# Patient Record
Sex: Male | Born: 1965 | Race: White | Hispanic: No | Marital: Married | Smoking: Never smoker
Health system: Southern US, Community
[De-identification: ages and names within clinical notes are randomized; demographics above are authoritative.]

## PROBLEM LIST (undated history)

## (undated) DIAGNOSIS — N200 Calculus of kidney: Secondary | ICD-10-CM

## (undated) HISTORY — PX: TESTICLE SURGERY: SHX794

## (undated) HISTORY — PX: HERNIA REPAIR: SHX51

## (undated) HISTORY — PX: KNEE SURGERY: SHX244

## (undated) HISTORY — PX: OTHER SURGICAL HISTORY: SHX169

## (undated) HISTORY — PX: LITHOTRIPSY: SUR834

---

## 2003-04-29 ENCOUNTER — Encounter: Admission: RE | Admit: 2003-04-29 | Discharge: 2003-04-29 | Payer: Self-pay | Admitting: Otolaryngology

## 2014-11-05 ENCOUNTER — Other Ambulatory Visit: Payer: Self-pay | Admitting: Cardiology

## 2014-11-05 DIAGNOSIS — R079 Chest pain, unspecified: Secondary | ICD-10-CM

## 2014-11-10 ENCOUNTER — Encounter (HOSPITAL_COMMUNITY)
Admission: RE | Admit: 2014-11-10 | Discharge: 2014-11-10 | Disposition: A | Discharge status: Home / Self Care | Payer: 59 | Source: Ambulatory Visit | Attending: Cardiology | Admitting: Cardiology

## 2014-11-10 ENCOUNTER — Encounter (HOSPITAL_COMMUNITY): Admission: RE | Admit: 2014-11-10 | Payer: 59 | Source: Ambulatory Visit

## 2014-11-10 DIAGNOSIS — R079 Chest pain, unspecified: Secondary | ICD-10-CM | POA: Diagnosis not present

## 2014-11-10 DIAGNOSIS — R0602 Shortness of breath: Secondary | ICD-10-CM | POA: Diagnosis not present

## 2014-11-10 MED ORDER — TECHNETIUM TC 99M SESTAMIBI GENERIC - CARDIOLITE
10.0000 | Freq: Once | INTRAVENOUS | Status: AC | PRN
  Administered 2014-11-10: 10 via INTRAVENOUS

## 2014-11-10 MED ORDER — TECHNETIUM TC 99M SESTAMIBI GENERIC - CARDIOLITE
30.0000 | Freq: Once | INTRAVENOUS | Status: AC | PRN
  Administered 2014-11-10: 30 via INTRAVENOUS

## 2015-03-02 DIAGNOSIS — R7303 Prediabetes: Secondary | ICD-10-CM | POA: Diagnosis not present

## 2015-03-02 DIAGNOSIS — Z9109 Other allergy status, other than to drugs and biological substances: Secondary | ICD-10-CM | POA: Diagnosis not present

## 2015-03-02 DIAGNOSIS — E782 Mixed hyperlipidemia: Secondary | ICD-10-CM | POA: Diagnosis not present

## 2015-04-19 DIAGNOSIS — R7303 Prediabetes: Secondary | ICD-10-CM | POA: Diagnosis not present

## 2015-06-10 DIAGNOSIS — L550 Sunburn of first degree: Secondary | ICD-10-CM | POA: Diagnosis not present

## 2015-06-10 DIAGNOSIS — M109 Gout, unspecified: Secondary | ICD-10-CM | POA: Diagnosis not present

## 2015-06-20 DIAGNOSIS — M109 Gout, unspecified: Secondary | ICD-10-CM | POA: Diagnosis not present

## 2015-07-16 DIAGNOSIS — M722 Plantar fascial fibromatosis: Secondary | ICD-10-CM | POA: Diagnosis not present

## 2015-07-16 DIAGNOSIS — M7742 Metatarsalgia, left foot: Secondary | ICD-10-CM | POA: Diagnosis not present

## 2015-08-06 DIAGNOSIS — M7742 Metatarsalgia, left foot: Secondary | ICD-10-CM | POA: Diagnosis not present

## 2015-08-06 DIAGNOSIS — M722 Plantar fascial fibromatosis: Secondary | ICD-10-CM | POA: Diagnosis not present

## 2015-10-02 DIAGNOSIS — S86812A Strain of other muscle(s) and tendon(s) at lower leg level, left leg, initial encounter: Secondary | ICD-10-CM | POA: Diagnosis not present

## 2015-10-02 DIAGNOSIS — M1712 Unilateral primary osteoarthritis, left knee: Secondary | ICD-10-CM | POA: Diagnosis not present

## 2015-10-02 DIAGNOSIS — M2392 Unspecified internal derangement of left knee: Secondary | ICD-10-CM | POA: Diagnosis not present

## 2015-10-04 DIAGNOSIS — M25562 Pain in left knee: Secondary | ICD-10-CM | POA: Diagnosis not present

## 2015-10-09 DIAGNOSIS — M2242 Chondromalacia patellae, left knee: Secondary | ICD-10-CM | POA: Diagnosis not present

## 2015-10-09 DIAGNOSIS — S83201A Bucket-handle tear of unspecified meniscus, current injury, left knee, initial encounter: Secondary | ICD-10-CM | POA: Diagnosis not present

## 2015-10-09 DIAGNOSIS — M659 Synovitis and tenosynovitis, unspecified: Secondary | ICD-10-CM | POA: Diagnosis not present

## 2015-10-09 DIAGNOSIS — M94262 Chondromalacia, left knee: Secondary | ICD-10-CM | POA: Diagnosis not present

## 2015-10-09 DIAGNOSIS — S83242A Other tear of medial meniscus, current injury, left knee, initial encounter: Secondary | ICD-10-CM | POA: Diagnosis not present

## 2015-12-08 ENCOUNTER — Emergency Department (HOSPITAL_COMMUNITY)
Admission: EM | Admit: 2015-12-08 | Discharge: 2015-12-08 | Disposition: A | Discharge status: Home / Self Care | Payer: 59 | Attending: Emergency Medicine | Admitting: Emergency Medicine

## 2015-12-08 ENCOUNTER — Emergency Department (HOSPITAL_COMMUNITY): Payer: 59

## 2015-12-08 ENCOUNTER — Encounter (HOSPITAL_COMMUNITY): Payer: Self-pay | Admitting: Emergency Medicine

## 2015-12-08 DIAGNOSIS — Z91010 Allergy to peanuts: Secondary | ICD-10-CM | POA: Diagnosis not present

## 2015-12-08 DIAGNOSIS — N132 Hydronephrosis with renal and ureteral calculous obstruction: Secondary | ICD-10-CM | POA: Diagnosis not present

## 2015-12-08 DIAGNOSIS — N200 Calculus of kidney: Secondary | ICD-10-CM | POA: Diagnosis not present

## 2015-12-08 DIAGNOSIS — N202 Calculus of kidney with calculus of ureter: Secondary | ICD-10-CM | POA: Diagnosis not present

## 2015-12-08 DIAGNOSIS — R109 Unspecified abdominal pain: Secondary | ICD-10-CM | POA: Diagnosis not present

## 2015-12-08 HISTORY — DX: Calculus of kidney: N20.0

## 2015-12-08 LAB — BASIC METABOLIC PANEL
ANION GAP: 7 (ref 5–15)
BUN: 16 mg/dL (ref 6–20)
CALCIUM: 9.3 mg/dL (ref 8.9–10.3)
CO2: 27 mmol/L (ref 22–32)
CREATININE: 1.39 mg/dL — AB (ref 0.61–1.24)
Chloride: 108 mmol/L (ref 101–111)
GFR, EST NON AFRICAN AMERICAN: 57 mL/min — AB (ref >60)
Glucose, Bld: 148 mg/dL — ABNORMAL HIGH (ref 65–99)
Potassium: 4.1 mmol/L (ref 3.5–5.1)
SODIUM: 142 mmol/L (ref 135–145)

## 2015-12-08 LAB — CBC
HCT: 42.5 % (ref 39.0–52.0)
HEMOGLOBIN: 14.9 g/dL (ref 13.0–17.0)
MCH: 31.1 pg (ref 26.0–34.0)
MCHC: 35.1 g/dL (ref 30.0–36.0)
MCV: 88.7 fL (ref 78.0–100.0)
PLATELETS: 257 10*3/uL (ref 150–400)
RBC: 4.79 MIL/uL (ref 4.22–5.81)
RDW: 12.8 % (ref 11.5–15.5)
WBC: 13.8 10*3/uL — AB (ref 4.0–10.5)

## 2015-12-08 LAB — URINALYSIS, ROUTINE W REFLEX MICROSCOPIC
BILIRUBIN URINE: NEGATIVE
Glucose, UA: NEGATIVE mg/dL
Hgb urine dipstick: NEGATIVE
KETONES UR: NEGATIVE mg/dL
LEUKOCYTES UA: NEGATIVE
NITRITE: NEGATIVE
PH: 5 (ref 5.0–8.0)
PROTEIN: NEGATIVE mg/dL
Specific Gravity, Urine: 1.02 (ref 1.005–1.030)

## 2015-12-08 MED ORDER — OXYCODONE-ACETAMINOPHEN 5-325 MG PO TABS: 1.0000 | ORAL_TABLET | Freq: Four times a day (QID) | ORAL | 0 refills | Status: DC | PRN

## 2015-12-08 MED ORDER — ONDANSETRON HCL 4 MG/2ML IJ SOLN
4.0000 mg | Freq: Once | INTRAMUSCULAR | Status: AC
  Administered 2015-12-08: 4 mg via INTRAVENOUS
  Filled 2015-12-08: qty 2

## 2015-12-08 MED ORDER — ONDANSETRON HCL 4 MG PO TABS
4.0000 mg | ORAL_TABLET | Freq: Three times a day (TID) | ORAL | 0 refills | Status: AC | PRN
Start: 2015-12-08 — End: ?

## 2015-12-08 MED ORDER — FENTANYL CITRATE (PF) 100 MCG/2ML IJ SOLN
50.0000 ug | INTRAMUSCULAR | Status: DC | PRN
  Administered 2015-12-08: 50 ug via NASAL
  Filled 2015-12-08: qty 2

## 2015-12-08 MED ORDER — TAMSULOSIN HCL 0.4 MG PO CAPS: 0.4000 mg | ORAL_CAPSULE | Freq: Every day | ORAL | 0 refills | Status: DC

## 2015-12-08 MED ORDER — KETOROLAC TROMETHAMINE 30 MG/ML IJ SOLN
30.0000 mg | Freq: Once | INTRAMUSCULAR | Status: AC
  Administered 2015-12-08: 30 mg via INTRAVENOUS
  Filled 2015-12-08: qty 1

## 2015-12-08 MED ORDER — TRAMADOL HCL 50 MG PO TABS: 50.0000 mg | ORAL_TABLET | Freq: Four times a day (QID) | ORAL | 0 refills | Status: AC | PRN

## 2015-12-08 MED ORDER — TAMSULOSIN HCL 0.4 MG PO CAPS: 0.4000 mg | ORAL_CAPSULE | Freq: Every day | ORAL | 0 refills | Status: AC

## 2015-12-08 MED ORDER — HYDROMORPHONE HCL 1 MG/ML IJ SOLN
1.0000 mg | Freq: Once | INTRAMUSCULAR | Status: AC
  Administered 2015-12-08: 1 mg via INTRAVENOUS
  Filled 2015-12-08: qty 1

## 2015-12-08 NOTE — ED Notes (Signed)
Successful IV attempt but unable to obtain blood.  Will attempt when pt feels more comfortable.

## 2015-12-08 NOTE — ED Provider Notes (Signed)
[facility]-EMERGENCY DEPT Provider Note   CSN: [id number] Arrival date & time: 12/08/15  1554     History   Chief Complaint Chief Complaint  Patient presents with  . Flank Pain    HPI Jonathon Gross is a 50 y.o. male.  HPI   50 year old male with hx of kidney stone here with flank pain.  Patient report acute onset of right flank pain that started approximately 2-3 hours ago. Pain initially starting his right flank but now has traveled down to his right groin. Rate pain as 12 out of 10 nothing seems to make it better or worse. States that he felt nauseous, vomited once, break out into sweats and feeling chills. He has urinated before the pain started and hasn't urinated since. He denies having fever, chills, lightheadedness, dizziness, chest pain, shortness of breath, productive cough, hematuria, scrotal swelling, testicle pain, or rash. Denies any recent injury or strenuous activities. He has history of right inguinal hernia that was surgically repaired. He had a lithotripsy procedure done in 2005.     Past Medical History:  Diagnosis Date  . Kidney stones     There are no active problems to display for this patient.   Past Surgical History:  Procedure Laterality Date  . HERNIA REPAIR    . KNEE SURGERY    . LITHOTRIPSY    . orthoscopy    . TESTICLE SURGERY         Home Medications    Prior to Admission medications   Not on File    Family History No family history on file.  Social History Social History  Substance Use Topics  . Smoking status: Never Smoker  . Smokeless tobacco: Current User    Types: Chew  . Alcohol use No     Allergies   Bee venom; Peanuts [peanut oil]; Milk-related compounds; Percocet [oxycodone-acetaminophen]; and Shellfish allergy   Review of Systems Review of Systems  All other systems reviewed and are negative.    Physical Exam Updated Vital Signs BP 159/92   Pulse 65   Temp 97.7 F (36.5 C) (Oral)   Resp 19   Ht  6' (1.829 m)   Wt 97.5 kg   SpO2 98%   BMI 29.16 kg/m   Physical Exam  Constitutional: He appears well-developed and well-nourished. He appears distressed (Patient appears uncomfortable, writhing in bed).  HENT:  Head: Atraumatic.  Eyes: Conjunctivae are normal.  Neck: Neck supple.  Cardiovascular: Normal rate and regular rhythm.   Pulmonary/Chest: Effort normal and breath sounds normal.  Abdominal: Soft. Bowel sounds are normal. He exhibits no distension. There is no tenderness.  Genitourinary:  Genitourinary Comments: Chaperone present during exam. No inguinal lymphadenopathy or inguinal hernia noted. Has nontender on palpation with normal lie. Normal scrotum. Perineum is soft and nontender.  Musculoskeletal:  No CVA tenderness.  Neurological: He is alert.  Skin: No rash noted.  Psychiatric: He has a normal mood and affect.  Nursing note and vitals reviewed.    ED Treatments / Results  Labs (all labs ordered are listed, but only abnormal results are displayed) Labs Reviewed  BASIC METABOLIC PANEL - Abnormal; Notable for the following:       Result Value   Glucose, Bld 148 (*)    Creatinine, Ser 1.39 (*)    GFR calc non Af Amer 57 (*)    All other components within normal limits  CBC - Abnormal; Notable for the following:    WBC 13.8 (*)  All other components within normal limits  URINE CULTURE  URINALYSIS, ROUTINE W REFLEX MICROSCOPIC (NOT AT [facility])    EKG  EKG Interpretation None       Radiology Ct Renal Stone Study  Result Date: 12/08/2015 CLINICAL DATA:  50 year old male with acute right flank pain radiating to the lower abdomen beginning after urination. Initial encounter. EXAM: CT ABDOMEN AND PELVIS WITHOUT CONTRAST TECHNIQUE: Multidetector CT imaging of the abdomen and pelvis was performed following the standard protocol without IV contrast. COMPARISON:  CT Abdomen and Pelvis 08/27/2003 FINDINGS: Lower chest: Negative.  No pericardial or pleural  effusion. No upper abdominal free air. Hepatobiliary: New heterogeneous steatosis of the liver since 2005. Some fatty sparing along the gallbladder fossa. Negative noncontrast gallbladder. Pancreas: Negative. Spleen: Negative. Adrenals/Urinary Tract: Negative adrenal glands. Negative noncontrast left kidney and left ureter. Mild to moderate right hydronephrosis. Moderate right perinephric stranding. Right hydroureter with periureteral stranding. Multiple intrarenal calculi on the right individually up to 6 mm. The right ureter remains asymmetrically enlarged to the ureterovesical junction where a small round 3 mm calculus is present (series 2, image 84 and coronal image 106. Otherwise unremarkable urinary bladder. Stomach/Bowel: Negative rectum. Mild to moderate sigmoid diverticulosis is new since 2005, no active inflammation. Decompressed left colon. Negative transverse colon. Negative right colon. Normal appendix. Negative terminal ileum. No dilated small bowel. Relatively decompressed stomach. Decompressed duodenum. Vascular/Lymphatic: Negative noncontrast appearance of vasculature. No lymphadenopathy identified. Reproductive: Negative. Other: No pelvic free fluid. Musculoskeletal: No acute osseous abnormality identified. IMPRESSION: 1. Acute obstructive uropathy on the right. Round 3 mm calculus at the right UVJ. The degree of right perinephric stranding raises the possibility of associated forniceal rupture. 2. Multiple other right renal calculi, individually up to 6 mm. No left nephrolithiasis. 3. Hepatic steatosis, new since 2005. 4. Diverticulosis of the sigmoid colon. Electronically Signed   By: Odessa FlemingH  Hall M.D.   On: 12/08/2015 18:17    Procedures Procedures (including critical care time)  Medications Ordered in ED Medications  fentaNYL (SUBLIMAZE) injection 50 mcg (50 mcg Nasal Given 12/08/15 1607)  ketorolac (TORADOL) 30 MG/ML injection 30 mg (not administered)  ondansetron (ZOFRAN) injection 4 mg  (not administered)     Initial Impression / Assessment and Plan / ED Course  I have reviewed the triage vital signs and the nursing notes.  Pertinent labs & imaging results that were available during my care of the patient were reviewed by me and considered in my medical decision making (see chart for details).  Clinical Course     BP 130/76   Pulse (!) 57   Temp 97.7 F (36.5 C) (Oral)   Resp 18   Ht 6' (1.829 m)   Wt 97.5 kg   SpO2 98%   BMI 29.16 kg/m    Final Clinical Impressions(s) / ED Diagnoses   Final diagnoses:  Kidney stone    New Prescriptions New Prescriptions   ONDANSETRON (ZOFRAN) 4 MG TABLET    Take 1 tablet (4 mg total) by mouth every 8 (eight) hours as needed for nausea or vomiting.   OXYCODONE-ACETAMINOPHEN (PERCOCET/ROXICET) 5-325 MG TABLET    Take 1 tablet by mouth every 6 (six) hours as needed for moderate pain or severe pain.   TAMSULOSIN (FLOMAX) 0.4 MG CAPS CAPSULE    Take 1 capsule (0.4 mg total) by mouth daily.   5:19 PM Patient with history of kidney stones here with acute onset of right flank pain radiates to: Likely suggestive of a kidney stone.  He appears very uncomfortable, pain medication given. Will obtain CT renal study. I have low suspicion for aortic dissection, shingle, acute urinary retention, ruptured appendicitis, or other acute emergent pathology.  8:07 PM UA without any signs of urinary tract infection. Urine culture sent. Elevated white count of 13.8 likely reactive. Acute renal insufficiency with a creatinine of 1.39. CT revealing renal stone study demonstrated a 3 mm stone at the UVJ on the right side and multiple non-occlusive stones in the right kidney. I discussed this finding with on-call urologist, Dr. Judie GrieveLomboy who recommend outpatient follow-up as needed. After receiving several doses of pain medication, patient felt much better and appears to be comfortable. He is stable for discharge. He will follow-up palpation further care.  Return precaution discussed.  Care discussed with DR. Tegeler.    Fayrene HelperBowie Kaia Depaolis, PA-C 12/08/15 2009    Canary Brimhristopher J Tegeler, MD 12/09/15 [phone]

## 2015-12-08 NOTE — ED Notes (Signed)
Lab delay - pt actively vomiting/dry heaving.  RN aware.

## 2015-12-08 NOTE — ED Notes (Signed)
PA at bedside.

## 2015-12-08 NOTE — ED Notes (Signed)
Nurse is going to start a IV and collect the labs 

## 2015-12-08 NOTE — Discharge Instructions (Signed)
You have a 3mm kidney stone on the right side.  Please take medications as prescribed.  Follow up with urology for further management as you have several kidney stones on the right kidney.

## 2015-12-08 NOTE — ED Triage Notes (Signed)
Patient c/o right flank pain about 3 hours ago that radiates now to right lower abd area. Patient states that after he urinated 3 hours ago he has urgency to urinate but states cant urinate.  Patient has PMH kidney stone.

## 2015-12-08 NOTE — ED Notes (Signed)
Pt doesn't think he can give a UA at this time.

## 2015-12-08 NOTE — ED Notes (Signed)
Patient transported to CT 

## 2015-12-10 LAB — URINE CULTURE: Culture: <10000 — AB

## 2016-02-29 DIAGNOSIS — J111 Influenza due to unidentified influenza virus with other respiratory manifestations: Secondary | ICD-10-CM | POA: Diagnosis not present

## 2017-06-12 IMAGING — CT CT RENAL STONE PROTOCOL
2 of 4 series · 16 of 46 positions shown, 18 images · non-contrast
Comparison: CT Abdomen and Pelvis 08/27/2003

CLINICAL DATA: 50-year-old male with acute right flank pain
radiating to the lower abdomen beginning after urination. Initial
encounter.

EXAM:
CT ABDOMEN AND PELVIS WITHOUT CONTRAST
TECHNIQUE: Multidetector CT imaging of the abdomen and pelvis was performed
following the standard protocol without IV contrast.

[Series 2: axial st · axial · 0.91mm/px · z∈[+726,+1201]mm · 13 of 105 slices shown, 15 images]
[im 5/105  soft-tissue]
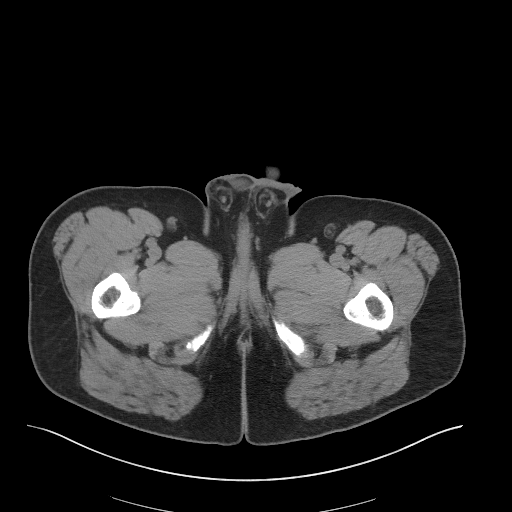
[im 5/105  bone]
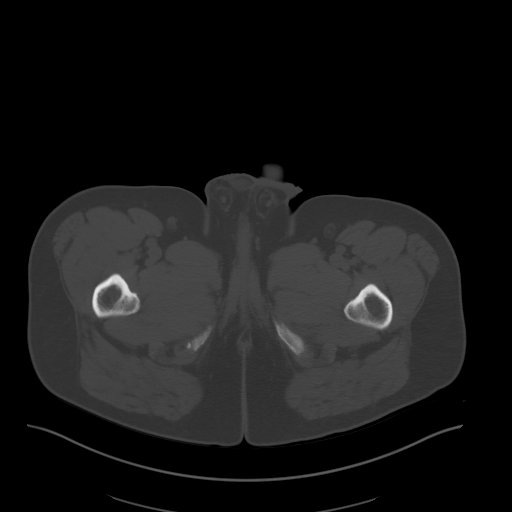
[im 15/105  soft-tissue]
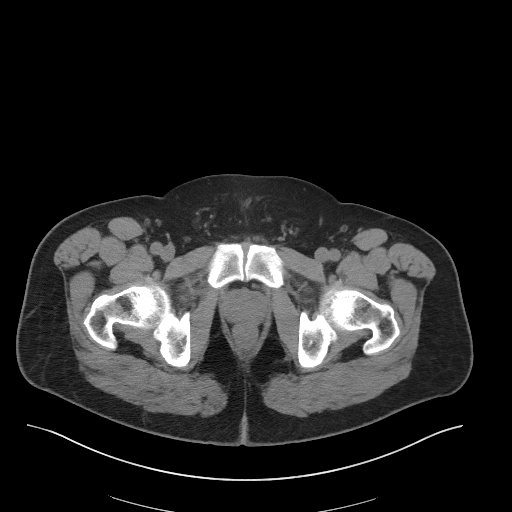
[im 24/105  soft-tissue]
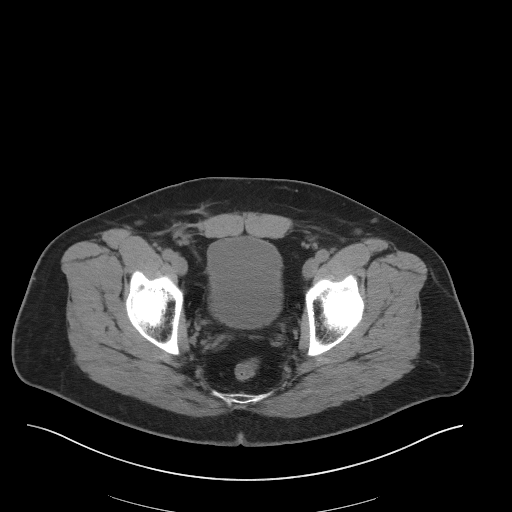
[im 29/105  soft-tissue]
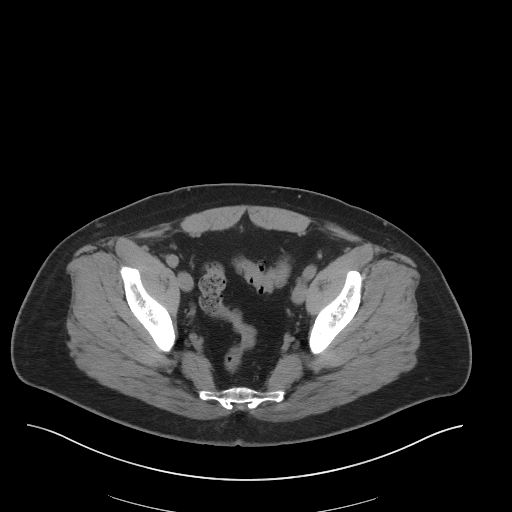
[im 38/105  soft-tissue]
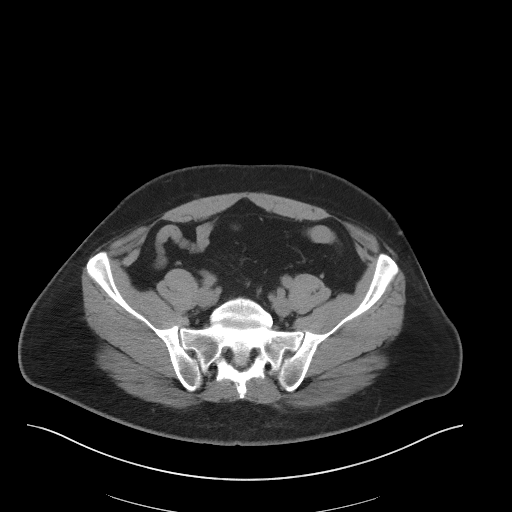
[im 43/105  soft-tissue]
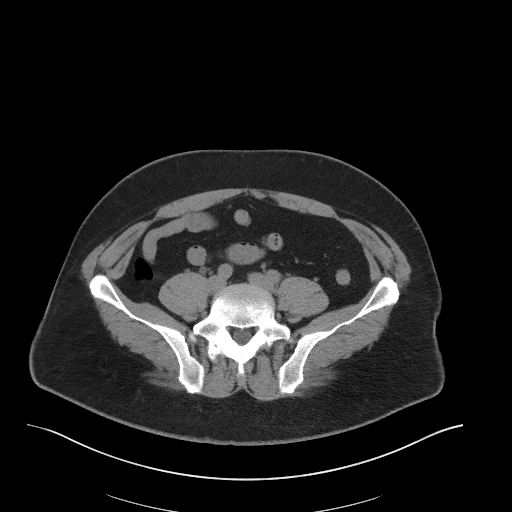
[im 53/105  soft-tissue]
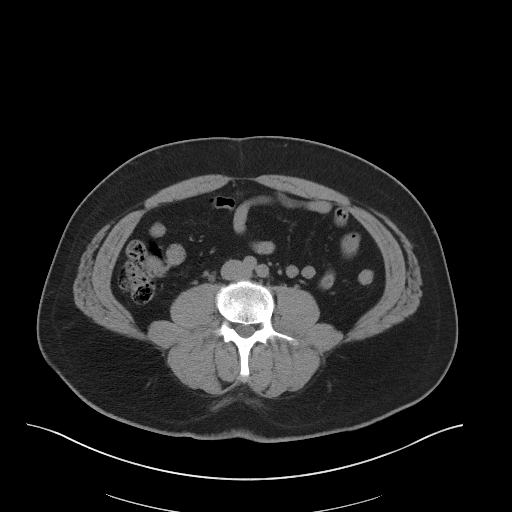
[im 62/105  soft-tissue]
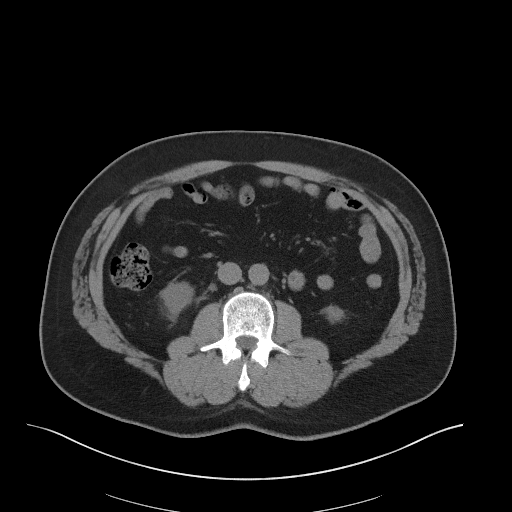
[im 67/105  soft-tissue]
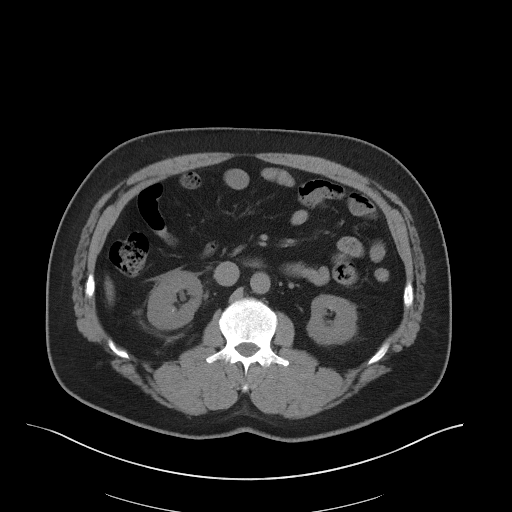
[im 67/105  bone]
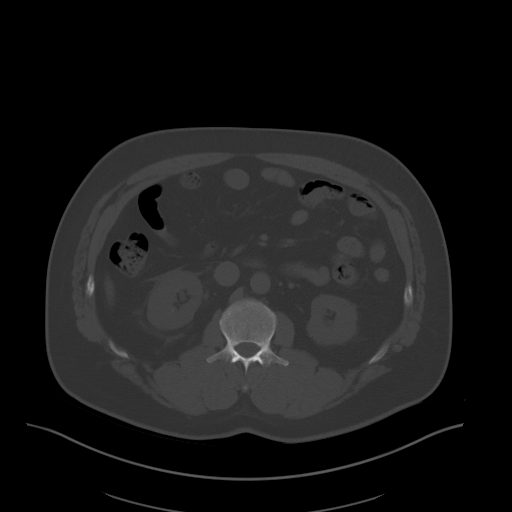
[im 76/105  soft-tissue]
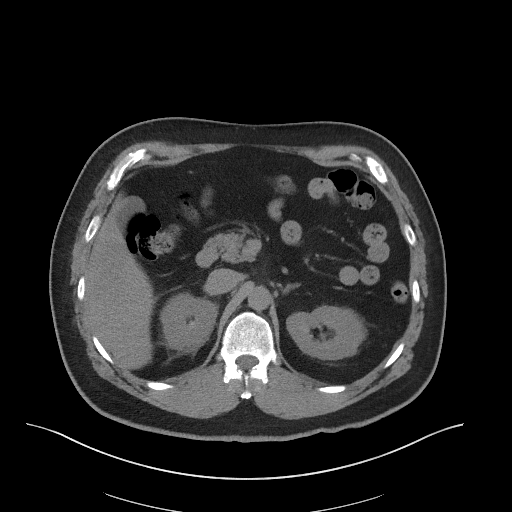
[im 81/105  soft-tissue]
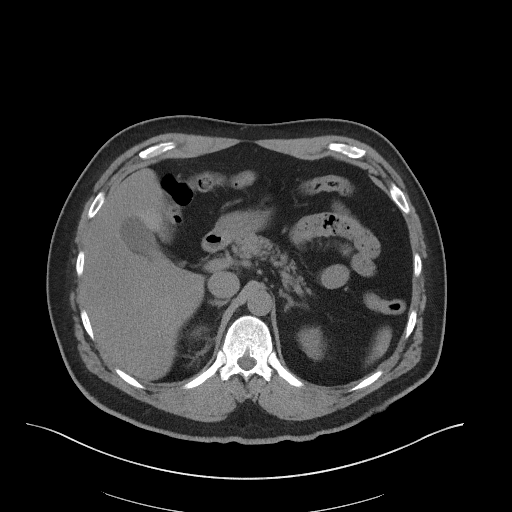
[im 90/105  soft-tissue]
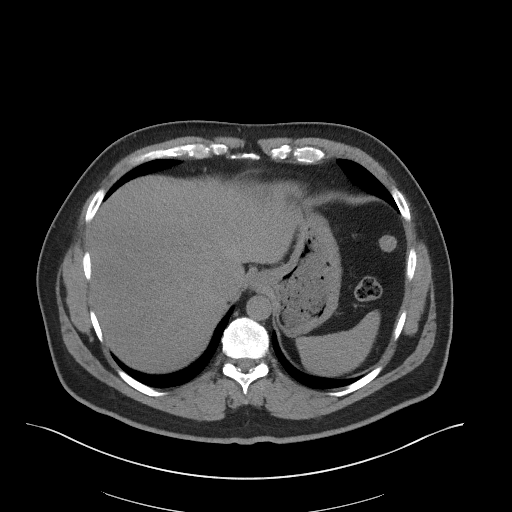
[im 100/105  soft-tissue]
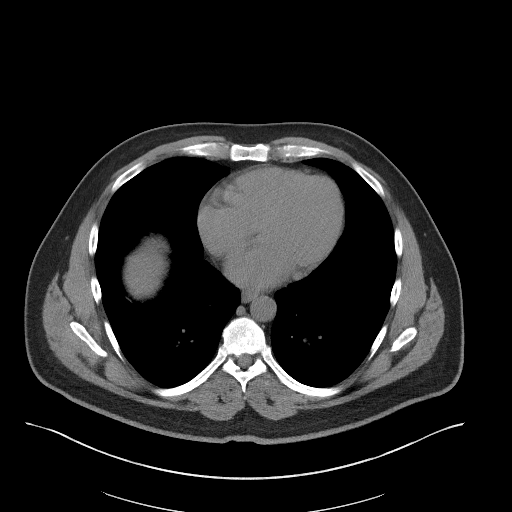

[Series 4: coronal · coronal · 0.87mm/px · 3 of 167 slices shown]
[im 56/167  soft-tissue]
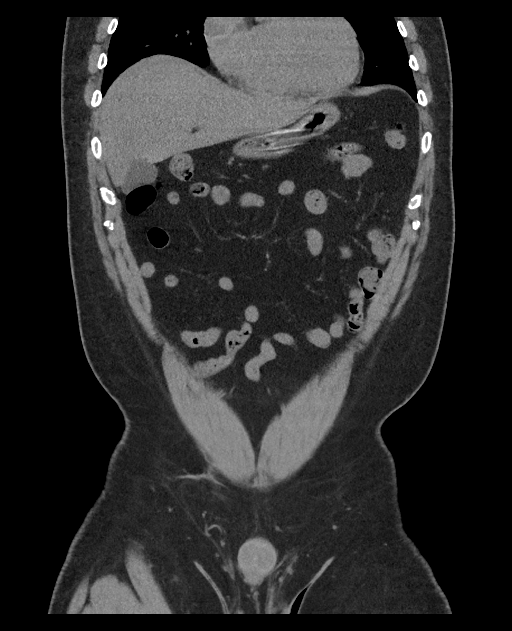
[im 74/167  soft-tissue]
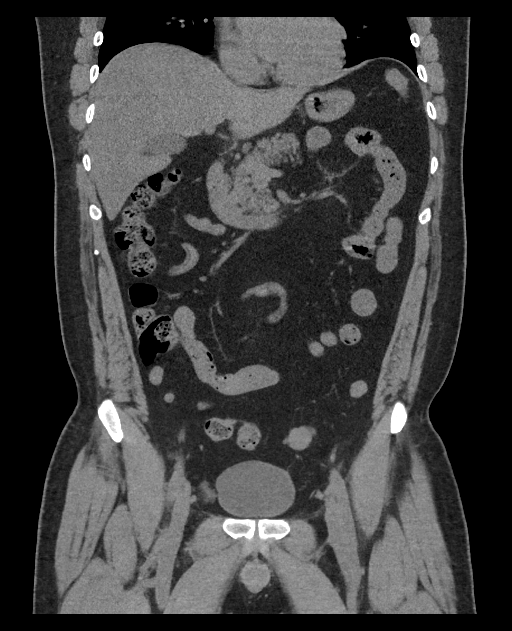
[im 93/167  soft-tissue]
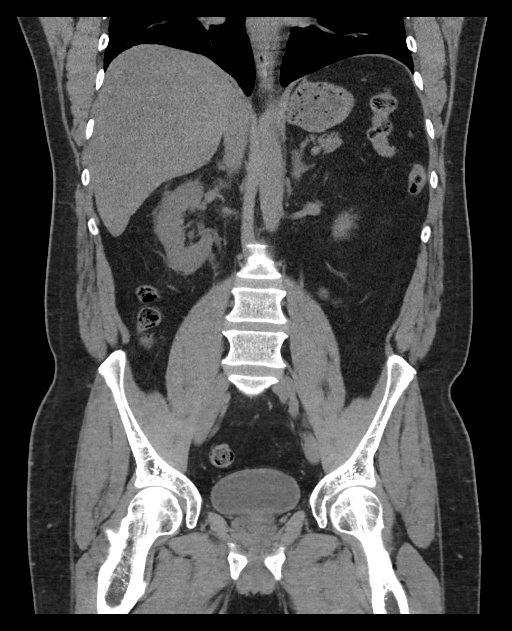

[16 of 46 positions shown; findings below may reference images not displayed]

FINDINGS: Lower chest: Negative.  No pericardial or pleural effusion.

No upper abdominal free air.

Hepatobiliary: New heterogeneous steatosis of the liver since 3661.
Some fatty sparing along the gallbladder fossa. Negative noncontrast
gallbladder.

Pancreas: Negative.

Spleen: Negative.

Adrenals/Urinary Tract: Negative adrenal glands.

Negative noncontrast left kidney and left ureter.

Mild to moderate right hydronephrosis. Moderate right perinephric
stranding. Right hydroureter with periureteral stranding. Multiple
intrarenal calculi on the right individually up to 6 mm. The right
ureter remains asymmetrically enlarged to the ureterovesical
junction where a small round 3 mm calculus is present (series 2,
image 84 and coronal image 106. Otherwise unremarkable urinary
bladder.

Stomach/Bowel: Negative rectum. Mild to moderate sigmoid
diverticulosis is new since 3661, no active inflammation.
Decompressed left colon. Negative transverse colon. Negative right
colon. Normal appendix. Negative terminal ileum. No dilated small
bowel. Relatively decompressed stomach. Decompressed duodenum.

Vascular/Lymphatic: Negative noncontrast appearance of vasculature.
No lymphadenopathy identified.

Reproductive: Negative.

Other: No pelvic free fluid.

Musculoskeletal: No acute osseous abnormality identified.
IMPRESSION: 1. Acute obstructive uropathy on the right. Round 3 mm calculus at
the right UVJ. The degree of right perinephric stranding raises the
possibility of associated forniceal rupture.
2. Multiple other right renal calculi, individually up to 6 mm. No
left nephrolithiasis.
3. Hepatic steatosis, new since [DATE]. Diverticulosis of the sigmoid colon.
